# Patient Record
Sex: Female | Born: 1970 | Race: White | Hispanic: Yes | Marital: Married | Smoking: Never smoker
Health system: Southern US, Community
[De-identification: ages and names within clinical notes are randomized; demographics above are authoritative.]

## PROBLEM LIST (undated history)

## (undated) DIAGNOSIS — L309 Dermatitis, unspecified: Secondary | ICD-10-CM

## (undated) DIAGNOSIS — M109 Gout, unspecified: Secondary | ICD-10-CM

## (undated) DIAGNOSIS — T7840XA Allergy, unspecified, initial encounter: Secondary | ICD-10-CM

## (undated) DIAGNOSIS — K649 Unspecified hemorrhoids: Secondary | ICD-10-CM

## (undated) DIAGNOSIS — B029 Zoster without complications: Secondary | ICD-10-CM

## (undated) DIAGNOSIS — D509 Iron deficiency anemia, unspecified: Secondary | ICD-10-CM

## (undated) DIAGNOSIS — A048 Other specified bacterial intestinal infections: Secondary | ICD-10-CM

## (undated) HISTORY — DX: Unspecified hemorrhoids: K64.9

## (undated) HISTORY — DX: Iron deficiency anemia, unspecified: D50.9

## (undated) HISTORY — PX: TUBAL LIGATION: SHX77

## (undated) HISTORY — DX: Zoster without complications: B02.9

## (undated) HISTORY — PX: BREAST BIOPSY: SHX20

## (undated) HISTORY — DX: Allergy, unspecified, initial encounter: T78.40XA

## (undated) HISTORY — DX: Gout, unspecified: M10.9

## (undated) HISTORY — DX: Dermatitis, unspecified: L30.9

## (undated) HISTORY — DX: Other specified bacterial intestinal infections: A04.8

---

## 2008-11-27 IMAGING — US US OB LIMITED
1 series · 3 of 3 positions shown · non-contrast
Comparison: none

OBSTETRICAL ULTRASOUND:

 This ultrasound exam was performed in the [HOSPITAL] Ultrasound Department.  The OB US report was generated in the AS system, and faxed to the ordering physician.  This report is also available in [REDACTED] PACS.

[Series 1: us ob limited · 3 of 3 slices shown]
[im 1/3]
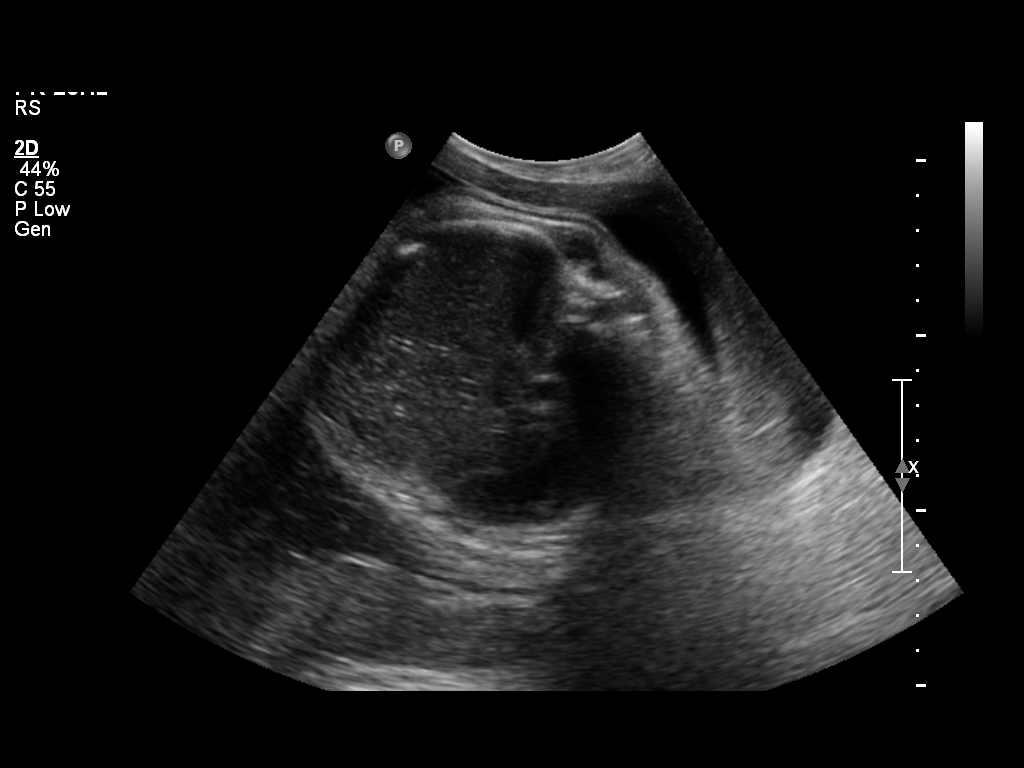
[im 2/3]
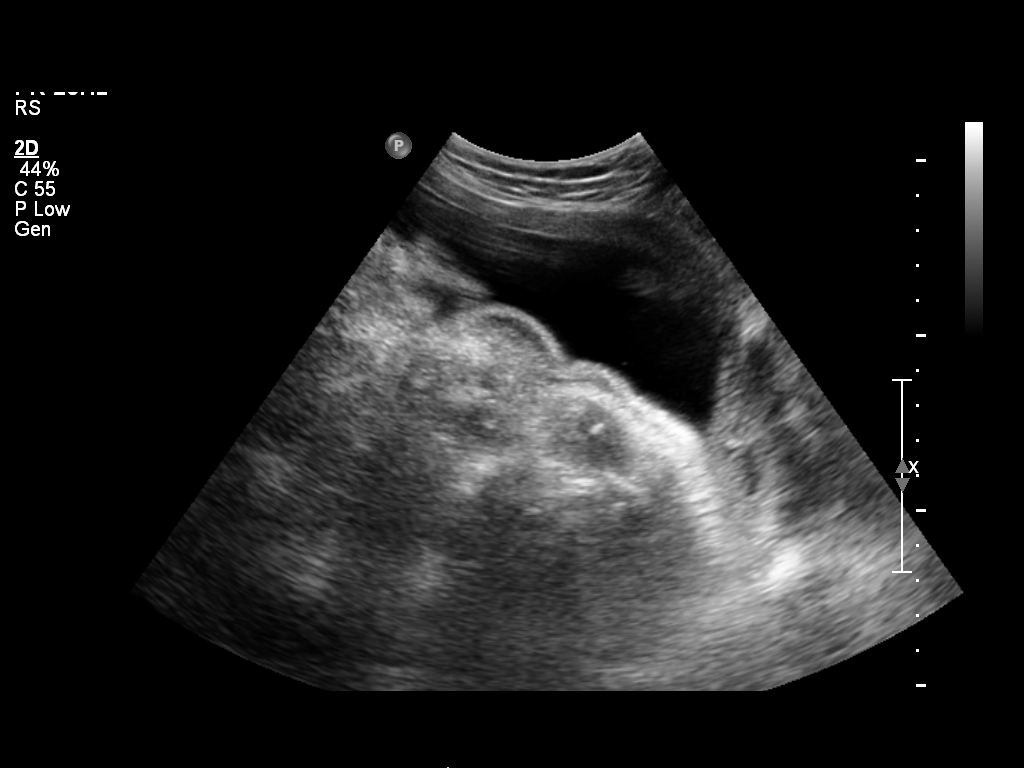
[im 3/3]
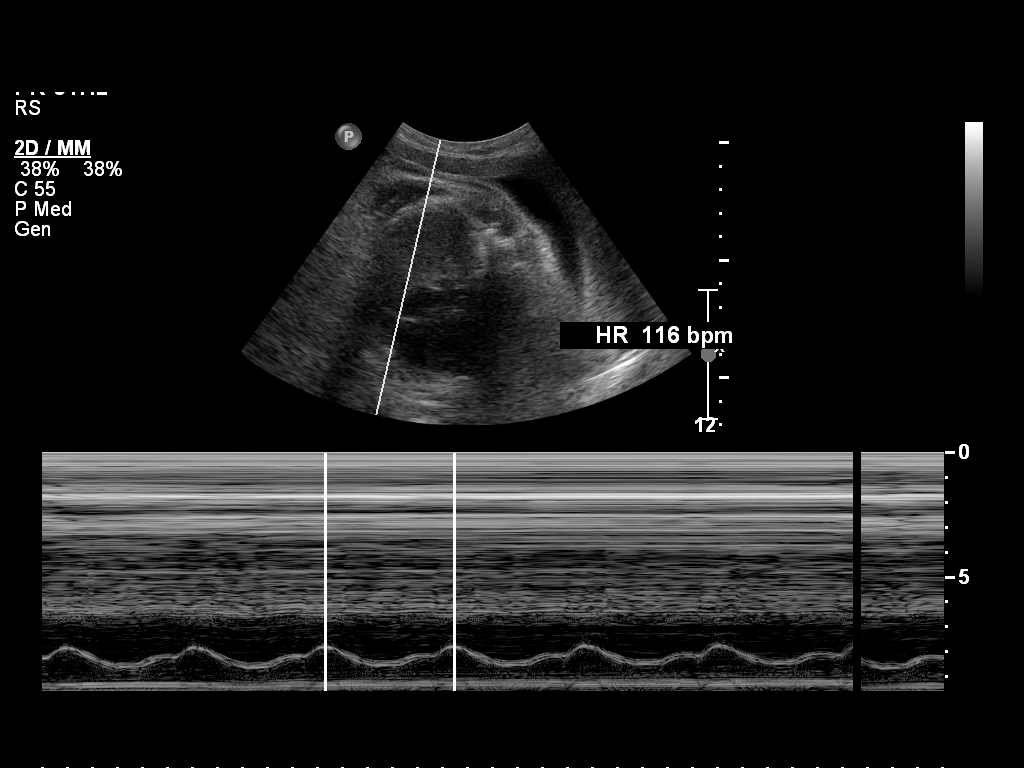

[3 of 3 positions shown; findings below may reference images not displayed]

IMPRESSION: See AS Obstetric US report.

## 2010-10-13 NOTE — Discharge Summary (Signed)
NAMEMarland Kitchen  Bethany, Taylor NO.:  [id number]   MEDICAL RECORD NO.:  [id number]          PATIENT TYPE:  INP   LOCATION:  9102                          FACILITY:  [facility]   PHYSICIAN:  Kathreen Cosier, M.D.DATE OF BIRTH:  10/20/1970   DATE OF ADMISSION:  10/25/2006  DATE OF DISCHARGE:  10/28/2006                               DISCHARGE SUMMARY   HOSPITAL COURSE:  The patient is a 40 year old gravida 3, para 2-0-0-2,  who had a previous C-section.  Her EDC was November 06, 2006.  During this  pregnancy, it was noted that she had anti-c and anti-S and these were  followed at [facility].  Titers remained very low throughout the  pregnancy.  She was noted to have a transverse lie and came in in labor.  The patient underwent classical C-section and tubal ligation.  Postoperatively, she did well.  Her RPR was negative.  HIV was negative.  She was A-positive.  On admission, her hemoglobin was 12.2, platelets  330,000, white count 12.9.  Postop hemoglobin 10.9, platelets 293,000,  white count 13.1.  She was discharged home on the 3rd postoperative day,  ambulatory, on a regular diet, on Tylox for pain.   FOLLOWUP:  To see me in 6 weeks.   DISCHARGE DIAGNOSES:  1. Status post classical cesarean section for transverse lie, in      labor.  2. Tubal ligation for multiparity.           ______________________________  Kathreen Cosier, M.D.     BAM/MEDQ  D:  10/28/2006  T:  10/28/2006  Job:  [id number]

## 2010-10-16 NOTE — Op Note (Signed)
   NAMEMarland Kitchen  Bethany Taylor, Bethany Taylor                ACCOUNT NO.:  [id number]   MEDICAL RECORD NO.:  [id number]                   PATIENT TYPE:  INP   LOCATION:  9321                                 FACILITY:  [facility]   PHYSICIAN:  Conni Elliot, M.D.             DATE OF BIRTH:  [birth year]   DATE OF PROCEDURE:  01/24/2002  DATE OF DISCHARGE:  01/27/2002                                 OPERATIVE REPORT   PREOPERATIVE DIAGNOSES:  Double footling breech in active labor.   POSTOPERATIVE DIAGNOSES:  Double footling breech in active labor.   OPERATION:  Low transverse cesarean.   OPERATOR:  Conni Elliot, M.D. and Dr.  Derrill Kay.   ANESTHESIA:  Spinal.   OPERATIVE FINDINGS:  A 6 pound 5 ounce female with Apgars of 7 and 9.  Cord  pH of 7.33.   OPERATIVE PROCEDURE:  After placing patient under spinal anesthetic with  patient supine left lateral tilt position receiving oxygen, abdomen was  prepped and draped in a sterile fashion.  Low transverse Pfannenstiel  incision was made.  Incision made through skin and subcutaneous fascia.  Rectus muscles separated midline.  Peritoneal cavity entered.  Bladder flap  created.  Low transverse uterine incision was made.  Baby was delivered from  the breech presentation without difficulty.  There was no entrapment of  fetal head.  Cord was doubly clamped and cut.  Baby handed to neonatologists  in attendance.  Placenta was delivered spontaneously.  Uterus, bladder flap,  anterior peritoneum, fascia closed in routine fashion.  Estimated blood loss  less than 800 cc.  Instruments count correct.                                               Conni Elliot, M.D.    ASG/MEDQ  D:  04/19/2002  T:  04/19/2002  Job:  [id number]

## 2011-11-08 DIAGNOSIS — R102 Pelvic and perineal pain: Secondary | ICD-10-CM

## 2021-11-24 DIAGNOSIS — R1013 Epigastric pain: Secondary | ICD-10-CM

## 2021-11-24 DIAGNOSIS — Z8619 Personal history of other infectious and parasitic diseases: Secondary | ICD-10-CM

## 2021-11-24 DIAGNOSIS — D509 Iron deficiency anemia, unspecified: Secondary | ICD-10-CM

## 2022-01-01 DIAGNOSIS — K317 Polyp of stomach and duodenum: Secondary | ICD-10-CM

## 2022-01-01 DIAGNOSIS — K648 Other hemorrhoids: Secondary | ICD-10-CM

## 2022-01-01 DIAGNOSIS — K2950 Unspecified chronic gastritis without bleeding: Secondary | ICD-10-CM

## 2022-01-01 DIAGNOSIS — R1011 Right upper quadrant pain: Secondary | ICD-10-CM

## 2022-01-01 DIAGNOSIS — R1013 Epigastric pain: Secondary | ICD-10-CM

## 2022-01-01 DIAGNOSIS — Z8619 Personal history of other infectious and parasitic diseases: Secondary | ICD-10-CM

## 2022-01-01 DIAGNOSIS — K2970 Gastritis, unspecified, without bleeding: Secondary | ICD-10-CM

## 2022-01-01 DIAGNOSIS — D509 Iron deficiency anemia, unspecified: Secondary | ICD-10-CM

## 2022-01-01 NOTE — Op Note (Signed)
[facility] Patient Name: Bethany Taylor Procedure Date: 01/01/2022 1:06 PM MRN: [id number] Endoscopist: Nicole Kindred "Bethany Taylor ,  Age: 51 Referring MD:  Date of Birth: [birth year] Gender: Female Account #: [id number] Procedure:                Upper GI endoscopy Indications:              Epigastric abdominal pain, Abdominal pain in the                            right upper quadrant, Previously treated for                            Helicobacter pylori, Iron deficiency anemia Medicines:                Monitored Anesthesia Care Procedure:                Pre-Anesthesia Assessment:                           - Prior to the procedure, a History and Physical                            was performed, and patient medications and                            allergies were reviewed. The patient's tolerance of                            previous anesthesia was also reviewed. The risks                            and benefits of the procedure and the sedation                            options and risks were discussed with the patient.                            All questions were answered, and informed consent                            was obtained. Prior Anticoagulants: The patient has                            taken no previous anticoagulant or antiplatelet                            agents. ASA Grade Assessment: II - A patient with                            mild systemic disease. After reviewing the risks                            and benefits, the patient was deemed in  satisfactory condition to undergo the procedure.                           After obtaining informed consent, the endoscope was                            passed under direct vision. Throughout the                            procedure, the patient's blood pressure, pulse, and                            oxygen saturations were monitored continuously. The                             Endoscope was introduced through the mouth, and                            advanced to the second part of duodenum. The upper                            GI endoscopy was accomplished without difficulty.                            The patient tolerated the procedure well. Scope In: Scope Out: Findings:                 The examined esophagus was normal.                           Localized inflammation characterized by congestion                            (edema) and erythema was found in the gastric body                            and in the gastric antrum. Biopsies were taken with                            a cold forceps for histology.                           A single 10 mm sessile polyp with no bleeding and                            no stigmata of recent bleeding was found in the                            gastric body. The polyp was removed with a cold                            snare. Resection and retrieval were complete.  The examined duodenum was normal. Biopsies were                            taken with a cold forceps for histology. Complications:            No immediate complications. Estimated Blood Loss:     Estimated blood loss was minimal. Impression:               - Normal esophagus.                           - Gastritis. Biopsied.                           - A single gastric polyp. Resected and retrieved.                           - Normal examined duodenum. Biopsied. Recommendation:           - Use Protonix (pantoprazole) 40 mg PO BID for 8                            weeks.                           - Await pathology results.                           - Perform a colonoscopy today. [address] Taylor,  01/01/2022 2:03:23 PM

## 2022-01-01 NOTE — Op Note (Signed)
[facility] Patient Name: Bethany Taylor Procedure Date: 01/01/2022 1:06 PM MRN: [id number] Endoscopist: Nicole Kindred "Bethany Taylor ,  Age: 51 Referring MD:  Date of Birth: 08-21-1970 Gender: Female Account #: [id number] Procedure:                Colonoscopy Indications:              Iron deficiency anemia Medicines:                Monitored Anesthesia Care Procedure:                Pre-Anesthesia Assessment:                           - Prior to the procedure, a History and Physical                            was performed, and patient medications and                            allergies were reviewed. The patient's tolerance of                            previous anesthesia was also reviewed. The risks                            and benefits of the procedure and the sedation                            options and risks were discussed with the patient.                            All questions were answered, and informed consent                            was obtained. Prior Anticoagulants: The patient has                            taken no previous anticoagulant or antiplatelet                            agents. ASA Grade Assessment: II - A patient with                            mild systemic disease. After reviewing the risks                            and benefits, the patient was deemed in                            satisfactory condition to undergo the procedure.                           After obtaining informed consent, the colonoscope  was passed under direct vision. Throughout the                            procedure, the patient's blood pressure, pulse, and                            oxygen saturations were monitored continuously. The                            CF [id number] #[id number] was introduced through the anus                            and advanced to the the terminal ileum. The                            colonoscopy was performed  without difficulty. The                            patient tolerated the procedure well. The quality                            of the bowel preparation was excellent. The                            terminal ileum, ileocecal valve, appendiceal                            orifice, and rectum were photographed. Scope In: 1:44:16 PM Scope Out: 1:57:45 PM Scope Withdrawal Time: 0 hours 9 minutes 48 seconds  Total Procedure Duration: 0 hours 13 minutes 29 seconds  Findings:                 The terminal ileum appeared normal.                           Non-bleeding internal hemorrhoids were found during                            retroflexion.                           The exam was otherwise without abnormality. Complications:            No immediate complications. Estimated Blood Loss:     Estimated blood loss: none. Impression:               - The examined portion of the ileum was normal.                           - Non-bleeding internal hemorrhoids.                           - The examination was otherwise normal.                           - No specimens collected. Recommendation:           -  Discharge patient to home (with escort).                           - Await pathology results.                           - Repeat colonoscopy in 10 years for screening                            purposes.                           - Return to GI clinic in 6 weeks.                           - The findings and recommendations were discussed                            with the patient. [address]ah Taylor,  01/01/2022 2:05:49 PM

## 2022-01-01 NOTE — Progress Notes (Signed)
Called to room to assist during endoscopic procedure.  Patient ID and intended procedure confirmed with present staff. Received instructions for my participation in the procedure from the performing physician.  

## 2022-01-01 NOTE — Progress Notes (Signed)
GASTROENTEROLOGY PROCEDURE H&P NOTE   Primary Care Physician: Pcp, No    Reason for Procedure:   IDA, history of H pylori, RUQ/epigastric ab pain  Plan:    EGD/colonoscopy  Patient is appropriate for endoscopic procedure(s) in the ambulatory ([facility]) setting.  The nature of the procedure, as well as the risks, benefits, and alternatives were carefully and thoroughly reviewed with the patient. Ample time for discussion and questions allowed. The patient understood, was satisfied, and agreed to proceed.     HPI: Bethany Taylor is a 51 y.o. female who presents for EGD/colonoscopy for evaluation of IDA, history of H pylori, RUQ/epigastric ab pain .  Patient was most recently seen in the Gastroenterology Clinic on 11/24/21.  No interval change in medical history since that appointment. Please refer to that note for full details regarding GI history and clinical presentation.   Past Medical History:  Diagnosis Date   Allergies    Eczema    Gout    H. pylori infection    Hemorrhoids    Herpes zoster    IDA (iron deficiency anemia)     Past Surgical History:  Procedure Laterality Date   BREAST BIOPSY     CESAREAN SECTION     x2   TUBAL LIGATION      Prior to Admission medications   Medication Sig Start Date End Date Taking? Authorizing Provider  ferrous sulfate 325 (65 FE) MG tablet Take 325 mg by mouth daily with breakfast.    [provider]  pantoprazole (PROTONIX) 40 MG tablet Take 40 mg by mouth daily.    [provider]    Current Outpatient Medications  Medication Sig Dispense Refill   ferrous sulfate 325 (65 FE) MG tablet Take 325 mg by mouth daily with breakfast.     pantoprazole (PROTONIX) 40 MG tablet Take 40 mg by mouth daily.     Current Facility-Administered Medications  Medication Dose Route Frequency Provider Last Rate Last Admin   0.9 %  sodium chloride infusion  500 mL Intravenous Continuous Imogene Burn, MD         Allergies as of 01/01/2022 - Review Complete 01/01/2022  Allergen Reaction Noted   Pollen extract  11/16/2021    Family History  Problem Relation Age of Onset   Diabetes Mother    Cancer - Colon Mother    Hypertension Father    Skin cancer Father    Hypertension Sister     Social History   Socioeconomic History   Marital status: Married    Spouse name: Not on file   Number of children: Not on file   Years of education: Not on file   Highest education level: Not on file  Occupational History   Not on file  Tobacco Use   Smoking status: Never   Smokeless tobacco: Never  Vaping Use   Vaping Use: Never used  Substance and Sexual Activity   Alcohol use: Yes    Comment: Rare   Drug use: Not on file   Sexual activity: Not on file  Other Topics Concern   Not on file  Social History Narrative   Not on file   Social Determinants of Health   Financial Resource Strain: Not on file  Food Insecurity: Not on file  Transportation Needs: Not on file  Physical Activity: Not on file  Stress: Not on file  Social Connections: Not on file  Intimate Partner Violence: Not on file    Physical Exam: Vital  signs in last 24 hours: BP (!) 145/88   Pulse 72   Temp (!) 96.8 F (36 C)   Ht 5' (1.524 m)   Wt 122 lb (55.3 kg)   LMP 12/15/2021 Comment: BTL  SpO2 98%   BMI 23.83 kg/m  GEN: NAD EYE: Sclerae anicteric ENT: MMM CV: Non-tachycardic Pulm: No increased WOB GI: Soft NEURO:  Alert & Oriented   [name], MD [facility]   01/01/2022 1:12 PM

## 2022-01-01 NOTE — Patient Instructions (Signed)
YOU HAD AN ENDOSCOPIC PROCEDURE TODAY AT THE  ENDOSCOPY CENTER:   Refer to the procedure report that was given to you for any specific questions about what was found during the examination.  If the procedure report does not answer your questions, please call your gastroenterologist to clarify.  If you requested that your care partner not be given the details of your procedure findings, then the procedure report has been included in a sealed envelope for you to review at your convenience later.  YOU SHOULD EXPECT: Some feelings of bloating in the abdomen. Passage of more gas than usual.  Walking can help get rid of the air that was put into your GI tract during the procedure and reduce the bloating. If you had a lower endoscopy (such as a colonoscopy or flexible sigmoidoscopy) you may notice spotting of blood in your stool or on the toilet paper. If you underwent a bowel prep for your procedure, you may not have a normal bowel movement for a few days.  Please Note:  You might notice some irritation and congestion in your nose or some drainage.  This is from the oxygen used during your procedure.  There is no need for concern and it should clear up in a day or so.  SYMPTOMS TO REPORT IMMEDIATELY:  Following lower endoscopy (colonoscopy or flexible sigmoidoscopy):  Excessive amounts of blood in the stool  Significant tenderness or worsening of abdominal pains  Swelling of the abdomen that is new, acute  Fever of 100F or higher  Following upper endoscopy (EGD)  Vomiting of blood or coffee ground material  New chest pain or pain under the shoulder blades  Painful or persistently difficult swallowing  New shortness of breath  Fever of 100F or higher  Black, tarry-looking stools  For urgent or emergent issues, a gastroenterologist can be reached at any hour by calling [phone]. Do not use MyChart messaging for urgent concerns.    DIET:  We do recommend a small meal at first, but  then you may proceed to your regular diet.  Drink plenty of fluids but you should avoid alcoholic beverages for 24 hours.  ACTIVITY:  You should plan to take it easy for the rest of today and you should NOT DRIVE or use heavy machinery until tomorrow (because of the sedation medicines used during the test).    FOLLOW UP: Our staff will call the number listed on your records the next business day following your procedure.  We will call around 7:15- 8:00 am to check on you and address any questions or concerns that you may have regarding the information given to you following your procedure. If we do not reach you, we will leave a message.  If you develop any symptoms (ie: fever, flu-like symptoms, shortness of breath, cough etc.) before then, please call [phone].  If you test positive for Covid 19 in the 2 weeks post procedure, please call and report this information to us.    If any biopsies were taken you will be contacted by phone or by letter within the next 1-3 weeks.  Please call us at [phone] if you have not heard about the biopsies in 3 weeks.    SIGNATURES/CONFIDENTIALITY: You and/or your care partner have signed paperwork which will be entered into your electronic medical record.  These signatures attest to the fact that that the information above on your After Visit Summary has been reviewed and is understood.  Full responsibility of the confidentiality   of this discharge information lies with you and/or your care-partner.  

## 2022-01-01 NOTE — Progress Notes (Signed)
Sedate, gd SR, tolerated procedure well, VSS, report to RN 

## 2022-01-04 NOTE — Telephone Encounter (Signed)
  Follow up Call-     01/01/2022    1:10 PM  Call back number  Post procedure Call Back phone  # [phone]  Permission to leave phone message Yes     Patient questions:  Do you have a fever, pain , or abdominal swelling? Yes.   Pain Score  2 * Right abdominal pain, same as before procedure. Will call if changes or gets worse.   Have you tolerated food without any problems? Yes.    Have you been able to return to your normal activities? Yes.    Do you have any questions about your discharge instructions: Diet   No. Medications  No. Follow up visit  No.  Do you have questions or concerns about your Care? No.  Actions: * If pain score is 4 or above: No action needed, pain <4.

## 2022-01-04 NOTE — Telephone Encounter (Signed)
  Follow up Call-     01/01/2022    1:10 PM  Call back number  Post procedure Call Back phone  # [phone]  Permission to leave phone message Yes     Patient questions:  Do you have a fever, pain , or abdominal swelling? No. Pain Score  0 *  Have you tolerated food without any problems? Yes.    Have you been able to return to your normal activities? Yes.    Do you have any questions about your discharge instructions: Diet   No. Medications  No. Follow up visit  No.  Do you have questions or concerns about your Care? No.  Actions: * If pain score is 4 or above: No action needed, pain <4.
